# Patient Record
Sex: Male | Born: 1965 | Race: White | Hispanic: No | Marital: Married | Smoking: Never smoker
Health system: Southern US, Community
[De-identification: ages and names within clinical notes are randomized; demographics above are authoritative.]

## PROBLEM LIST (undated history)

## (undated) DIAGNOSIS — K219 Gastro-esophageal reflux disease without esophagitis: Secondary | ICD-10-CM

## (undated) DIAGNOSIS — F419 Anxiety disorder, unspecified: Secondary | ICD-10-CM

## (undated) HISTORY — DX: Anxiety disorder, unspecified: F41.9

## (undated) HISTORY — PX: VASECTOMY: SHX75

## (undated) HISTORY — PX: TONSILLECTOMY: SUR1361

## (undated) HISTORY — DX: Gastro-esophageal reflux disease without esophagitis: K21.9

## (undated) HISTORY — PX: COLOSTOMY: SHX63

---

## 2017-04-27 ENCOUNTER — Encounter (HOSPITAL_COMMUNITY): Payer: Self-pay | Admitting: Emergency Medicine

## 2017-04-27 ENCOUNTER — Ambulatory Visit (HOSPITAL_COMMUNITY)
Admission: EM | Admit: 2017-04-27 | Discharge: 2017-04-27 | Disposition: A | Discharge status: Home / Self Care | Payer: 59 | Attending: Family Medicine | Admitting: Family Medicine

## 2017-04-27 DIAGNOSIS — T162XXA Foreign body in left ear, initial encounter: Secondary | ICD-10-CM

## 2017-04-27 DIAGNOSIS — H9202 Otalgia, left ear: Secondary | ICD-10-CM

## 2017-04-27 MED ORDER — LIDOCAINE VISCOUS 2 % MT SOLN
OROMUCOSAL | Status: AC
  Filled 2017-04-27: qty 15

## 2017-04-27 NOTE — ED Triage Notes (Signed)
Pt believes some type of insect has flown into his left ear and stung him several times.  He is not sure if the insect is still in there, but he thinks it is.

## 2017-04-29 NOTE — ED Provider Notes (Signed)
  [facility]   [id number] 04/27/17 Arrival Time: 1301  ASSESSMENT & PLAN:  1. Foreign body of left ear, initial encounter    Viscous lidocaine used to numb ear canal and kill insect. Removed without complication using alligator forceps.  Re-exam of L ear reveals ear canal swelling with patency. Slight bleeding. No parts of insect observed in ear canal. Flushed.  Declines antibiotic ear drop at this time. Will monitor closely.  Outlined signs and symptoms indicating need for more acute intervention. Follow up here or in the Emergency Department if worsening. Patient verbalized understanding. After Visit Summary given.   SUBJECTIVE:  Patrick Harper is a 51 y.o. male who presents with complaint of insect that flew into his L ear an hour or so ago. Painful. Feels insect stinging him. Self removal with finger unsuccessful.  ROS: As per HPI.   OBJECTIVE:  Vitals:   04/27/17 1318  BP: 126/90  Pulse: 75  Temp: 98.6 F (37 C)  TempSrc: Oral  SpO2: 96%     General appearance: alert, cooperative, appears stated age and no distress Head: normocephalic; atraumatic Eyes: conjunctivae/corneas normal; EOMI. Ears: left with insect inside ear canal; appears to be up against TM; some swelling and erythema of ear canal Skin: normal   No Known Allergies  PMHx, SurgHx, SocialHx, Medications, and Allergies were reviewed in the Visit Navigator and updated as appropriate.       Vanessa Kick, MD 04/29/17 [phone]

## 2018-09-05 DIAGNOSIS — Z125 Encounter for screening for malignant neoplasm of prostate: Secondary | ICD-10-CM | POA: Diagnosis not present

## 2018-09-05 DIAGNOSIS — E7849 Other hyperlipidemia: Secondary | ICD-10-CM | POA: Diagnosis not present

## 2018-09-05 DIAGNOSIS — Z0000 Encounter for general adult medical examination without abnormal findings: Secondary | ICD-10-CM | POA: Diagnosis not present

## 2018-09-12 DIAGNOSIS — M545 Low back pain: Secondary | ICD-10-CM | POA: Diagnosis not present

## 2018-09-12 DIAGNOSIS — G250 Essential tremor: Secondary | ICD-10-CM | POA: Diagnosis not present

## 2018-09-12 DIAGNOSIS — E7849 Other hyperlipidemia: Secondary | ICD-10-CM | POA: Diagnosis not present

## 2018-09-12 DIAGNOSIS — Z0000 Encounter for general adult medical examination without abnormal findings: Secondary | ICD-10-CM | POA: Diagnosis not present

## 2018-09-12 DIAGNOSIS — Z23 Encounter for immunization: Secondary | ICD-10-CM | POA: Diagnosis not present

## 2018-09-12 DIAGNOSIS — Z1389 Encounter for screening for other disorder: Secondary | ICD-10-CM | POA: Diagnosis not present

## 2018-09-12 DIAGNOSIS — D72819 Decreased white blood cell count, unspecified: Secondary | ICD-10-CM | POA: Diagnosis not present

## 2018-09-14 ENCOUNTER — Other Ambulatory Visit: Payer: Self-pay | Admitting: Internal Medicine

## 2018-09-14 DIAGNOSIS — E785 Hyperlipidemia, unspecified: Secondary | ICD-10-CM

## 2018-11-17 DIAGNOSIS — R400 Somnolence: Secondary | ICD-10-CM | POA: Diagnosis not present

## 2018-11-17 DIAGNOSIS — Z6830 Body mass index (BMI) 30.0-30.9, adult: Secondary | ICD-10-CM | POA: Diagnosis not present

## 2018-11-17 DIAGNOSIS — R0683 Snoring: Secondary | ICD-10-CM | POA: Diagnosis not present

## 2019-04-29 DIAGNOSIS — Z1159 Encounter for screening for other viral diseases: Secondary | ICD-10-CM | POA: Diagnosis not present

## 2019-07-04 DIAGNOSIS — M5416 Radiculopathy, lumbar region: Secondary | ICD-10-CM | POA: Diagnosis not present

## 2019-07-04 DIAGNOSIS — M5136 Other intervertebral disc degeneration, lumbar region: Secondary | ICD-10-CM | POA: Diagnosis not present

## 2019-07-04 DIAGNOSIS — M545 Low back pain: Secondary | ICD-10-CM | POA: Diagnosis not present

## 2019-09-11 DIAGNOSIS — Z125 Encounter for screening for malignant neoplasm of prostate: Secondary | ICD-10-CM | POA: Diagnosis not present

## 2019-09-11 DIAGNOSIS — Z0000 Encounter for general adult medical examination without abnormal findings: Secondary | ICD-10-CM | POA: Diagnosis not present

## 2019-09-11 DIAGNOSIS — E7849 Other hyperlipidemia: Secondary | ICD-10-CM | POA: Diagnosis not present

## 2019-09-17 ENCOUNTER — Other Ambulatory Visit: Payer: Self-pay | Admitting: *Deleted

## 2019-09-17 DIAGNOSIS — Z20822 Contact with and (suspected) exposure to covid-19: Secondary | ICD-10-CM

## 2019-09-18 DIAGNOSIS — Z0000 Encounter for general adult medical examination without abnormal findings: Secondary | ICD-10-CM | POA: Diagnosis not present

## 2019-09-18 DIAGNOSIS — R0683 Snoring: Secondary | ICD-10-CM | POA: Diagnosis not present

## 2019-09-18 DIAGNOSIS — R400 Somnolence: Secondary | ICD-10-CM | POA: Diagnosis not present

## 2019-09-18 DIAGNOSIS — M5136 Other intervertebral disc degeneration, lumbar region: Secondary | ICD-10-CM | POA: Diagnosis not present

## 2019-09-18 DIAGNOSIS — E785 Hyperlipidemia, unspecified: Secondary | ICD-10-CM | POA: Diagnosis not present

## 2019-09-18 DIAGNOSIS — Z23 Encounter for immunization: Secondary | ICD-10-CM | POA: Diagnosis not present

## 2019-09-18 LAB — NOVEL CORONAVIRUS, NAA: SARS-CoV-2, NAA: NOT DETECTED

## 2019-09-24 ENCOUNTER — Other Ambulatory Visit: Payer: Self-pay | Admitting: Internal Medicine

## 2019-09-24 DIAGNOSIS — E785 Hyperlipidemia, unspecified: Secondary | ICD-10-CM

## 2019-10-10 ENCOUNTER — Encounter: Payer: Self-pay | Admitting: Gastroenterology

## 2019-10-19 ENCOUNTER — Ambulatory Visit
Admission: RE | Admit: 2019-10-19 | Discharge: 2019-10-19 | Disposition: A | Discharge status: Home / Self Care | Payer: No Typology Code available for payment source | Source: Ambulatory Visit | Attending: Internal Medicine | Admitting: Internal Medicine

## 2019-10-19 DIAGNOSIS — E785 Hyperlipidemia, unspecified: Secondary | ICD-10-CM

## 2019-10-31 ENCOUNTER — Ambulatory Visit (AMBULATORY_SURGERY_CENTER): Payer: Self-pay | Admitting: *Deleted

## 2019-10-31 ENCOUNTER — Other Ambulatory Visit: Payer: Self-pay

## 2019-10-31 VITALS — Ht 72.0 in | Wt 215.0 lb

## 2019-10-31 DIAGNOSIS — Z1159 Encounter for screening for other viral diseases: Secondary | ICD-10-CM

## 2019-10-31 DIAGNOSIS — Z1211 Encounter for screening for malignant neoplasm of colon: Secondary | ICD-10-CM

## 2019-10-31 MED ORDER — NA SULFATE-K SULFATE-MG SULF 17.5-3.13-1.6 GM/177ML PO SOLN: 1.0000 | Freq: Once | ORAL | 0 refills | Status: AC

## 2019-10-31 NOTE — Progress Notes (Signed)
No egg or soy allergy known to patient  No issues with past sedation with any surgeries  or procedures, no intubation problems  No diet pills per patient No home 02 use per patient  No blood thinners per patient  Pt denies issues with constipation  No A fib or A flutter  EMMI video sent to pt.  Due to the COVID-19 pandemic we are asking patients to follow these guidelines. Please only bring one care partner. Please be aware that your care partner may wait in the car in the parking lot or if they feel like they will be too hot to wait in the car, they may wait in the lobby on the 4th floor. All care partners are required to wear a mask the entire time (we do not have any that we can provide them), they need to practice social distancing, and we will do a Covid check for all patient's and care partners when you arrive. Also we will check their temperature and your temperature. If the care partner waits in their car they need to stay in the parking lot the entire time and we will call them on their cell phone when the patient is ready for discharge so they can bring the car to the front of the building. Also all patient's will need to wear a mask into building.   Pt verified name, DOB, address and insurance during PV today. Pt mailed instruction packet to included paper to complete and mail back to [facility] with addressed and stamped envelope, Emmi video, copy of consent form to read and not return, and instructions.  coupon mailed in packet. PV completed over the phone. Pt encouraged to call with questions or issues

## 2019-11-07 ENCOUNTER — Ambulatory Visit (INDEPENDENT_AMBULATORY_CARE_PROVIDER_SITE_OTHER): Payer: Self-pay

## 2019-11-07 ENCOUNTER — Other Ambulatory Visit: Payer: Self-pay | Admitting: Gastroenterology

## 2019-11-07 DIAGNOSIS — Z1159 Encounter for screening for other viral diseases: Secondary | ICD-10-CM | POA: Diagnosis not present

## 2019-11-08 LAB — SARS CORONAVIRUS 2 (TAT 6-24 HRS): SARS Coronavirus 2: NEGATIVE

## 2019-11-12 ENCOUNTER — Other Ambulatory Visit: Payer: Self-pay

## 2019-11-12 ENCOUNTER — Other Ambulatory Visit: Payer: Self-pay | Admitting: *Deleted

## 2019-11-12 ENCOUNTER — Encounter: Payer: Self-pay | Admitting: Gastroenterology

## 2019-11-12 ENCOUNTER — Ambulatory Visit (AMBULATORY_SURGERY_CENTER): Payer: BC Managed Care – PPO | Admitting: Gastroenterology

## 2019-11-12 VITALS — BP 110/72 | HR 64 | Temp 97.6°F | Resp 11 | Ht 72.0 in | Wt 215.0 lb

## 2019-11-12 DIAGNOSIS — Z1211 Encounter for screening for malignant neoplasm of colon: Secondary | ICD-10-CM | POA: Diagnosis not present

## 2019-11-12 DIAGNOSIS — D122 Benign neoplasm of ascending colon: Secondary | ICD-10-CM | POA: Diagnosis not present

## 2019-11-12 NOTE — Progress Notes (Signed)
Temp-JB VS-CW  Pt's states no medical or surgical changes since previsit or office visit.  

## 2019-11-12 NOTE — Progress Notes (Signed)
Pt tolerated well. VSS. Awake and to recovery. 

## 2019-11-12 NOTE — Op Note (Signed)
[facility] Patient Name: Patrick Harper Procedure Date: 11/12/2019 8:39 AM MRN: [id number] Endoscopist: [place]. Loletha Carrow , MD Age: 54 Referring MD:  Date of Birth: 1966/09/03 Gender: Male Account #: [id number] Procedure:                Colonoscopy Indications:              Screening for colorectal malignant neoplasm, This                            is the patient's first colonoscopy Medicines:                Monitored Anesthesia Care Procedure:                Pre-Anesthesia Assessment:                           - Prior to the procedure, a History and Physical                            was performed, and patient medications and                            allergies were reviewed. The patient's tolerance of                            previous anesthesia was also reviewed. The risks                            and benefits of the procedure and the sedation                            options and risks were discussed with the patient.                            All questions were answered, and informed consent                            was obtained. Prior Anticoagulants: The patient has                            taken no previous anticoagulant or antiplatelet                            agents. ASA Grade Assessment: II - A patient with                            mild systemic disease. After reviewing the risks                            and benefits, the patient was deemed in                            satisfactory condition to undergo the procedure.  After obtaining informed consent, the colonoscope                            was passed under direct vision. Throughout the                            procedure, the patient's blood pressure, pulse, and                            oxygen saturations were monitored continuously. The                            Colonoscope was introduced through the anus and                            advanced to the the cecum,  identified by                            appendiceal orifice and ileocecal valve. The                            colonoscopy was performed without difficulty. The                            patient tolerated the procedure well. The quality                            of the bowel preparation was fair in left colon,                            good in right colon after lavage. The ileocecal                            valve, appendiceal orifice, and rectum were                            photographed. The bowel preparation used was SUPREP                            via split dose instruction. Scope In: 8:46:02 AM Scope Out: 9:03:02 AM Scope Withdrawal Time: 0 hours 12 minutes 4 seconds  Total Procedure Duration: 0 hours 17 minutes 0 seconds  Findings:                 The perianal and digital rectal examinations were                            normal.                           Two sessile polyps were found in the proximal                            ascending colon. The polyps were diminutive in  size. These polyps were removed with a cold snare.                            Resection and retrieval were complete.                           Multiple diverticula were found in the left colon.                           The exam was otherwise without abnormality on                            direct and retroflexion views. Complications:            No immediate complications. Estimated Blood Loss:     Estimated blood loss was minimal. Impression:               - Preparation of the colon was fair.                           - Two diminutive polyps in the proximal ascending                            colon, removed with a cold snare. Resected and                            retrieved.                           - Diverticulosis in the left colon.                           - The examination was otherwise normal on direct                            and retroflexion  views. Recommendation:           - Patient has a contact number available for                            emergencies. The signs and symptoms of potential                            delayed complications were discussed with the                            patient. Return to normal activities tomorrow.                            Written discharge instructions were provided to the                            patient.                           - Resume previous diet.                           -  Continue present medications.                           - Await pathology results.                           - Repeat colonoscopy is recommended for                            surveillance. The colonoscopy date will be                            determined after pathology results from today's                            exam become available for review. Lennox Leikam L. Loletha Carrow, MD 11/12/2019 9:06:59 AM This report has been signed electronically.

## 2019-11-12 NOTE — Progress Notes (Signed)
Called to room to assist during endoscopic procedure.  Patient ID and intended procedure confirmed with present staff. Received instructions for my participation in the procedure from the performing physician.  

## 2019-11-12 NOTE — Patient Instructions (Signed)
Thank you for allowing [place] to care for you today!  Await pathology results of polyps removed by mail, approximately 2 weeks.  Will make recommendation at that time for next colonoscopy.  Resume previous diet and medications today.  Resume your normal activities tomorrow.  Handouts for polyps and diverticulosis provided.   YOU HAD AN ENDOSCOPIC PROCEDURE TODAY AT [facility]:   Refer to the procedure report that was given to you for any specific questions about what was found during the examination.  If the procedure report does not answer your questions, please call your gastroenterologist to clarify.  If you requested that your care partner not be given the details of your procedure findings, then the procedure report has been included in a sealed envelope for you to review at your convenience later.  YOU SHOULD EXPECT: Some feelings of bloating in the abdomen. Passage of more gas than usual.  Walking can help get rid of the air that was put into your GI tract during the procedure and reduce the bloating. If you had a lower endoscopy (such as a colonoscopy or flexible sigmoidoscopy) you may notice spotting of blood in your stool or on the toilet paper. If you underwent a bowel prep for your procedure, you may not have a normal bowel movement for a few days.  Please Note:  You might notice some irritation and congestion in your nose or some drainage.  This is from the oxygen used during your procedure.  There is no need for concern and it should clear up in a day or so.  SYMPTOMS TO REPORT IMMEDIATELY:   Following lower endoscopy (colonoscopy or flexible sigmoidoscopy):  Excessive amounts of blood in the stool  Significant tenderness or worsening of abdominal pains  Swelling of the abdomen that is new, acute  Fever of 100F or higher   For urgent or emergent issues, a gastroenterologist can be reached at any hour by calling [phone].   DIET:  We do recommend a  small meal at first, but then you may proceed to your regular diet.  Drink plenty of fluids but you should avoid alcoholic beverages for 24 hours.  ACTIVITY:  You should plan to take it easy for the rest of today and you should NOT DRIVE or use heavy machinery until tomorrow (because of the sedation medicines used during the test).    FOLLOW UP: Our staff will call the number listed on your records 48-72 hours following your procedure to check on you and address any questions or concerns that you may have regarding the information given to you following your procedure. If we do not reach you, we will leave a message.  We will attempt to reach you two times.  During this call, we will ask if you have developed any symptoms of COVID 19. If you develop any symptoms (ie: fever, flu-like symptoms, shortness of breath, cough etc.) before then, please call [phone].  If you test positive for Covid 19 in the 2 weeks post procedure, please call and report this information to [place].    If any biopsies were taken you will be contacted by phone or by letter within the next 1-3 weeks.  Please call us at [phone] if you have not heard about the biopsies in 3 weeks.    SIGNATURES/CONFIDENTIALITY: You and/or your care partner have signed paperwork which will be entered into your electronic medical record.  These signatures attest to the fact that that the information above  on your After Visit Summary has been reviewed and is understood.  Full responsibility of the confidentiality of this discharge information lies with you and/or your care-partner.

## 2019-11-14 ENCOUNTER — Telehealth: Payer: Self-pay

## 2019-11-14 NOTE — Telephone Encounter (Signed)
  Follow up Call-  Call back number 11/12/2019  Post procedure Call Back phone  # [phone]  Permission to leave phone message Yes     Patient questions:  Do you have a fever, pain , or abdominal swelling? No. Pain Score  0 *  Have you tolerated food without any problems? Yes.    Have you been able to return to your normal activities? Yes.    Do you have any questions about your discharge instructions: Diet   No. Medications  No. Follow up visit  No.  Do you have questions or concerns about your Care? No.  Actions: * If pain score is 4 or above: No action needed, pain <4.   1. Have you developed a fever since your procedure? no  2.   Have you had an respiratory symptoms (SOB or cough) since your procedure? no  3.   Have you tested positive for COVID 19 since your procedure no  4.   Have you had any family members/close contacts diagnosed with the COVID 19 since your procedure?  no   If yes to any of these questions please route to Joylene John, RN and Alphonsa Gin, Therapist, sports.

## 2019-11-15 ENCOUNTER — Encounter: Payer: Self-pay | Admitting: Gastroenterology

## 2019-12-25 ENCOUNTER — Ambulatory Visit: Payer: BC Managed Care – PPO | Attending: Internal Medicine

## 2019-12-25 DIAGNOSIS — R519 Headache, unspecified: Secondary | ICD-10-CM | POA: Diagnosis not present

## 2019-12-25 DIAGNOSIS — Z20822 Contact with and (suspected) exposure to covid-19: Secondary | ICD-10-CM | POA: Diagnosis not present

## 2019-12-26 LAB — NOVEL CORONAVIRUS, NAA: SARS-CoV-2, NAA: NOT DETECTED

## 2020-08-19 ENCOUNTER — Encounter: Payer: Self-pay | Admitting: Neurology

## 2020-08-20 ENCOUNTER — Other Ambulatory Visit: Payer: Self-pay

## 2020-08-20 ENCOUNTER — Ambulatory Visit: Payer: BC Managed Care – PPO | Admitting: Neurology

## 2020-08-20 ENCOUNTER — Encounter: Payer: Self-pay | Admitting: Neurology

## 2020-08-20 VITALS — BP 123/77 | HR 75 | Ht 72.0 in | Wt 216.0 lb

## 2020-08-20 DIAGNOSIS — R0683 Snoring: Secondary | ICD-10-CM | POA: Insufficient documentation

## 2020-08-20 DIAGNOSIS — G4730 Sleep apnea, unspecified: Secondary | ICD-10-CM | POA: Diagnosis not present

## 2020-08-20 DIAGNOSIS — G4710 Hypersomnia, unspecified: Secondary | ICD-10-CM | POA: Diagnosis not present

## 2020-08-20 DIAGNOSIS — G478 Other sleep disorders: Secondary | ICD-10-CM | POA: Diagnosis not present

## 2020-08-20 NOTE — Patient Instructions (Signed)

## 2020-08-20 NOTE — Progress Notes (Signed)
SLEEP MEDICINE CLINIC    Provider:  Larey Seat, MD  Primary Care Physician:  Shon Baton, MD [place] [address]     Referring Provider: Shon Baton, [address] [place],  [place] [address]          Chief Complaint according to patient   Patient presents with:     New Patient (Initial Visit)     pt alone, rm 10. presents today to advise that his wife has stated he snores and gasps in sleep. he averages 6-7 hrs but doesn't sleep straight through, and wakes up tired. He has tried nose strips and dental device and was not successful.      HISTORY OF PRESENT ILLNESS:  Patrick Harper is a 54  Year- old  Caucasian male patient and seen here in a sleep consultation  on 08/20/2020 from Dr Virgina Jock.  Chief concern according to patient : Patrick Harper explained that he has tried to reduce his snoring and gasping in his sleep by using a dental device but was not successful and actually did not fit well.  He has he has also tried nasal strips without much success either.  He wakes up and feels not refreshed not restored from his sleep even if he averages 6 to 7 hours.  He does often reporting fragmented interrupted sleep.   I have the pleasure of seeing Patrick Harper on 08-20-2020, a right -handed Caucasian male with a possible sleep disorder.  He  has a past medical history of Anxiety and GERD (gastroesophageal reflux disease). He had hernia surgery as a child.   His dental device was made by a dentist but he did not have a sleep study prior or after.   Sleep relevant medical history: Nocturia 1-2,  Tonsillectomy.     Family medical /sleep history: no other family member on CPAP with OSA, nobody with insomnia, no sleep walkers.    Social history:  Patient is working as a Chief Strategy Officer , for  the [employer], in conjunction with the [employer], and lives in a household with spouse - daughter is out of college, youngest started college at [employer].    The patient currently works in outreach, irregular hours.  Pets are present. Tobacco use; never .  ETOH use - socially, Caffeine intake in form of Coffee( daily 2-3 cups ) Soda( diet/ 2 week) Tea (/) or energy drinks.Regular exercise in form of golf..   Hobbies : golf. Sleep habits are as follows: The patient's dinner time is between 6-8 PM.  The patient goes to bed at 10 PM and continues to sleep for several hours, wakes for one bathroom break, at 2  AM.   The preferred sleep position is sideways, with the support of 2 pillows.  Dreams are reportedly frequent/ kicking in his sleep.Marland Kitchen   6 -7 AM is the usual rise time.  The patient wakes up spontaneously.   She reports not feeling refreshed or restored in AM, with symptoms such as dry mouth , rare morning headaches , and residual fatigue.  Naps are taken frequently, since working form home - 3-4 days a week, lasting from 20-30 minutes and are more refreshing than nocturnal sleep.    Review of Systems: Out of a complete 14 system review, the patient complains of only the following symptoms, and all other reviewed systems are negative.:  Fatigue, sleepiness , snoring, fragmented sleep, forme fruste of RLS  How likely are you to doze in the following situations: 0 = not likely, 1 = slight chance, 2 = moderate chance, 3 = high chance   Sitting and Reading? Watching Television? Sitting inactive in a public place (theater or meeting)? As a passenger in a car for an hour without a break? Lying down in the afternoon when circumstances permit? Sitting and talking to someone? Sitting quietly after lunch without alcohol? In a car, while stopped for a few minutes in traffic?   Total = 12/ 24 points   FSS endorsed at 43/ 63 points.   Social History   Socioeconomic History   Marital status: Married    Spouse name: Not on file   Number of children: Not on file   Years of education: Not on file   Highest education level: Not on file   Occupational History   Not on file  Tobacco Use   Smoking status: Never Smoker   Smokeless tobacco: Never Used  Vaping Use   Vaping Use: Never used  Substance and Sexual Activity   Alcohol use: Yes    Comment: socially on weekends   Drug use: No   Sexual activity: Not on file  Other Topics Concern   Not on file  Social History Narrative   Not on file   Social Determinants of Health   Financial Resource Strain:    Difficulty of Paying Living Expenses: Not on file  Food Insecurity:    Worried About [place] in the Last Year: Not on file   Ran Out of Food in the Last Year: Not on file  Transportation Needs:    Lack of Transportation (Medical): Not on file   Lack of Transportation (Non-Medical): Not on file  Physical Activity:    Days of Exercise per Week: Not on file   Minutes of Exercise per Session: Not on file  Stress:    Feeling of Stress : Not on file  Social Connections:    Frequency of Communication with Friends and Family: Not on file   Frequency of Social Gatherings with Friends and Family: Not on file   Attends Religious Services: Not on file   Active Member of Clubs or Organizations: Not on file   Attends Archivist Meetings: Not on file   Marital Status: Not on file    Family History  Problem Relation Age of Onset   Brain cancer Father    Colon cancer Neg Hx    Colon polyps Neg Hx    Esophageal cancer Neg Hx    Rectal cancer Neg Hx    Stomach cancer Neg Hx     Past Medical History:  Diagnosis Date   Anxiety    GERD (gastroesophageal reflux disease)     Past Surgical History:  Procedure Laterality Date   COLOSTOMY     TONSILLECTOMY     as child   VASECTOMY       Current Outpatient Medications on File Prior to Visit  Medication Sig Dispense Refill   buPROPion (WELLBUTRIN XL) 150 MG 24 hr tablet      citalopram (CELEXA) 40 MG tablet Take 40 mg by mouth daily.      ibuprofen (ADVIL)  200 MG tablet 1-2 tabs by mouth as needed for discomfort     Multiple Vitamin (MULTI-VITAMIN) tablet Take by mouth.     rosuvastatin (CRESTOR) 10 MG tablet Take 10 mg by mouth at bedtime.     No current facility-administered medications on  file prior to visit.    No Known Allergies  Physical exam:  Today's Vitals   08/20/20 1047  BP: 123/77  Pulse: 75  Weight: 216 lb (98 kg)  Height: 6' (1.829 m)   Body mass index is 29.29 kg/m.   Wt Readings from Last 3 Encounters:  08/20/20 216 lb (98 kg)  11/12/19 215 lb (97.5 kg)  10/31/19 215 lb (97.5 kg)     Ht Readings from Last 3 Encounters:  08/20/20 6' (1.829 m)  11/12/19 6' (1.829 m)  10/31/19 6' (1.829 m)      General: The patient is awake, alert and appears not in acute distress. The patient is well groomed. Head: Normocephalic, atraumatic. Neck is supple. Mallampati 2- with lateral pillars., narrow.  neck circumference:17 inches . Nasal airflow patent.  Retrognathia is seen.  Dental status: intact  Cardiovascular:  Regular rate and cardiac rhythm by pulse,  without distended neck veins. Respiratory: Lungs are clear to auscultation.  Skin:  Without evidence of ankle edema, or rash. Trunk: The patient's posture is erect.   Neurologic exam : The patient is awake and alert, oriented to place and time.   Memory subjective described as intact.  Attention span & concentration ability appears normal.  Speech is fluent,  without  dysarthria, dysphonia or aphasia.  Mood and affect are appropriate.   Cranial nerves: no loss of smell or taste reported  Pupils are equal and briskly reactive to light. Funduscopic exam deferred.   Extraocular movements in vertical and horizontal planes were intact and without nystagmus. No Diplopia. Visual fields by finger perimetry are intact. Hearing was intact to soft voice and finger rubbing.    Facial sensation intact to fine touch.  Facial motor strength is symmetric and tongue and uvula  move midline.  Neck ROM : rotation, tilt and flexion extension were normal for age and shoulder shrug was symmetrical.    Motor exam:  Symmetric bulk, tone and ROM.   Normal tone without cog wheeling, symmetric grip strength . Sensory:  Fine touch, pinprick and vibration were tested  and  normal.  Proprioception tested in the upper extremities was normal.  Coordination: Rapid alternating movements in the fingers/hands were of normal speed.  The Finger-to-nose maneuver was intact without evidence of ataxia, dysmetria or tremor.  Gait and station: Patient could rise unassisted from a seated position, walked without assistive device.  Stance is of normal width/ base and the patient turned with 3 steps.  Toe and heel walk were deferred.  Deep tendon reflexes: in the upper and lower extremities are symmetric and intact.  Babinski response was deferred.       After spending a total time of 40 minutes face to face and additional time for physical and neurologic examination, review of laboratory studies,  personal review of imaging studies, reports and results of other testing and review of referral information / records as far as provided in visit, I have established the following assessments:  1) Patrick Harper, his body mass index is under 30 and may contribute to snoring but is not at the point where I would consider risk factor for obstructive sleep apnea.  However obstructive sleep apnea seems to have been observed by his wife who has reported that he breathes a sometimes irregularly at that he is a loud snorer man in supine sleep position.  Since a dental device has not given him the desired relief and nasal strips have neither I  think it is time for a formal sleep evaluation with a sleep test.  We can do either a sleep lab test or a home sleep test which should be sufficient to screen for sleep apnea.  There are no risk factors that I can see for the presence of central sleep  apnea.    My Plan is to proceed with:  1) HST or PSG- screening for apnea.     I would like to thank Shon Baton, MD and Shon Baton, [address],  [place] [address] for allowing me to meet with and to take care of this pleasant patient.   In short, Patrick Harper is presenting with snoring and witnessed apnea, having non restorative.   I plan to follow up either personally or through our NP within 2-3  month.   CC: I will share my notes with Dr Russo/   Electronically signed by: Larey Seat, MD 08/20/2020 11:10 AM  [facility] certified by The [employer] of Sleep Medicine and Diplomate of the [employer] of Sleep Medicine. Board certified In Neurology through the [place], Fellow of the [employer] of Neurology. Medical Director of [employer].

## 2020-09-15 ENCOUNTER — Ambulatory Visit (INDEPENDENT_AMBULATORY_CARE_PROVIDER_SITE_OTHER): Payer: BC Managed Care – PPO | Admitting: Neurology

## 2020-09-15 DIAGNOSIS — G478 Other sleep disorders: Secondary | ICD-10-CM

## 2020-09-15 DIAGNOSIS — G4733 Obstructive sleep apnea (adult) (pediatric): Secondary | ICD-10-CM | POA: Diagnosis not present

## 2020-09-15 DIAGNOSIS — G4710 Hypersomnia, unspecified: Secondary | ICD-10-CM

## 2020-09-15 DIAGNOSIS — R0683 Snoring: Secondary | ICD-10-CM

## 2020-09-30 NOTE — Procedures (Signed)
Sleep Study Report   Patient Information     First Name: Patrick Last Name: Harper ID: [id number]  Birth Date: 2066/01/05 Age: 54 Gender: Male  Referring Provider: Dr. Shon Baton, MD BMI: 29.3 (W=216 lb, H=6' 0'')  Neck Circ.:  17 '' Epworth:  12/24   Sleep Study Information    Study Date: 09/15/20 S/H/A Version: [id number] / [id number] / 79  History:    Jonetta Speak was seen on 08-20-2020, a right -handed Caucasian male with a possible sleep disorder.  He is a 75- year -old patient who he has tried to reduce his snoring and gasping in his sleep by using a dental device but was not successful.  He has he has also tried nasal strips without much success either.  He wakes up and feels not refreshed not restored from his sleep even if he averages 6 to 7 hours.  He does often report fragmented interrupted sleep. He has a past medical history of Anxiety and GERD (gastroesophageal reflux disease). He had hernia surgery as a child.      Summary & Diagnosis:      This HST confirmed a moderate degree of OSA at an AHI of 21.6/h and REM AHI of 25.6/h. there were additional RERA related arousals, reflected in the RDI. SpO2 at nadir was 75% with a total desaturation time of under 20 minutes.  Recommendations:     This degree of apnea and type of apnea can be treated by dental device or CPAP. It would also be treatable by [facility] device. If the patient doesn't object, I would start with CPAP treatment by autotitration, with a setting from 5-16 cm water, 2 cm EPR and mask of patient's choice (this doesn't need to be a FFM) and heated humidity to be provided.  Interpreting Physician: Larey Seat, MD           Sleep Summary  Oxygen Saturation Statistics   Start Study Time: End Study Time: Total Recording Time:  9:57:58 PM 6:57:08 AM   8 h, 59 min  Total Sleep Time % REM of Sleep Time:  7 h, 27 min  25.3    Mean: 94 Minimum: 75 Maximum: 99  Mean of Desaturations Nadirs (%):   91  Oxygen  Desaturation. %:  4-9 10-20 >20 Total  Events Number Total   62  9 86.1 12.5  1 1.4  72 100.0  Oxygen Saturation: <90 <=88 <85 <80 <70  Duration (minutes): Sleep % 8.9 2.0 6.9 2.9 1.5 0.6 0.8 0.2 0.0 0.0     Respiratory Indices      Total Events REM NREM All Night  pRDI: pAHI 3%: ODI 4%: pAHIc 3%: % CSR: pAHI 4%:  218  161  72  67 15.5 98 33.0 25.6 17.1 8.5 28.0 20.3 7.2 9.2 29.3 21.6 9.7 9.0 13.2       Pulse Rate Statistics during Sleep (BPM)      Mean: 68 Minimum: 53 Maximum: 97    Indices are calculated using technically valid sleep time of 7 h, 26 min.                                     pAHI=21.6  Mild              Moderate                    Severe                                                 5              15                    30   Body Position Statistics  Position Supine Prone Right Left Non-Supine  Sleep (min) 86.5 0.0 188.5 172.5 361.0  Sleep % 19.3 0.0 42.1 38.5 80.7  pRDI 39.6 N/A 20.8 33.5 26.8  pAHI 3% 28.5 N/A 11.5 29.3 20.0  ODI 4% 18.1 N/A 2.9 12.9 7.7            Left   Right  Supine    Snoring Statistics Snoring Level (dB) >40 >50 >60 >70 >80 >Threshold (45)  Sleep (min) 195.7 5.1 0.6 0.0 0.0 17.3  Sleep % 43.7 1.1 0.1 0.0 0.0 3.9    Mean: 41 dB

## 2020-09-30 NOTE — Addendum Note (Signed)
Addended by: Larey Seat on: 09/30/2020 01:03 PM   Modules accepted: Orders

## 2020-09-30 NOTE — Progress Notes (Signed)
Summary & Diagnosis:     This HST confirmed a moderate degree of OSA at an AHI of 21.6/h  and REM AHI of 25.6/h. there were additional RERA related  arousals, reflected in the RDI. SpO2 at nadir was 75% with a  total desaturation time of under 20 minutes.   Recommendations:    This degree of apnea and type of apnea can be treated by dental  device or CPAP. It would also be treatable by [facility] device. If  the patient doesn't object, I would start with CPAP treatment by  autotitration, with a setting from 5-16 cm water, 2 cm EPR and  mask of patient's choice (this doesn't need to be a FFM) and  heated humidity to be provided.  Interpreting Physician: Larey Seat, MD

## 2020-10-01 ENCOUNTER — Telehealth: Payer: Self-pay | Admitting: Neurology

## 2020-10-01 NOTE — Telephone Encounter (Signed)
Called patient to discuss sleep study results. No answer at this time. LVM for the patient to call back.   

## 2020-10-01 NOTE — Telephone Encounter (Signed)
-----   Message from Larey Seat, MD sent at 09/30/2020  1:02 PM EST ----- Summary & Diagnosis:     This HST confirmed a moderate degree of OSA at an AHI of 21.6/h  and REM AHI of 25.6/h. there were additional RERA related  arousals, reflected in the RDI. SpO2 at nadir was 75% with a  total desaturation time of under 20 minutes.   Recommendations:    This degree of apnea and type of apnea can be treated by dental  device or CPAP. It would also be treatable by [facility] device. If  the patient doesn't object, I would start with CPAP treatment by  autotitration, with a setting from 5-16 cm water, 2 cm EPR and  mask of patient's choice (this doesn't need to be a FFM) and  heated humidity to be provided.  Interpreting Physician: Larey Seat, MD

## 2020-10-03 ENCOUNTER — Encounter: Payer: Self-pay | Admitting: Neurology

## 2020-10-07 NOTE — Telephone Encounter (Signed)
I called Patrick Harper. I advised Patrick Harper that Dr. Brett Fairy reviewed their sleep study results and found that Patrick Harper has moderate OSA. Dr. Brett Fairy recommends that Patrick Harper starts auto CPAP. I reviewed PAP compliance expectations with the Patrick Harper. Patrick Harper is agreeable to starting a CPAP. I advised Patrick Harper that an order will be sent to a DME, [facility] ([facility] and [facility] ([facility]) will call the Patrick Harper within about one week after they file with the Patrick Harper's insurance. [facility])  will show the Patrick Harper how to use the machine, fit for masks, and troubleshoot the CPAP if needed. A follow up appt will need to be made for insurance purposes with Dr. Brett Fairy or NP. Patrick Harper verbalized understanding to call us once he is scheduled to pick up his machine that way a initial CPAP visit can be made 31-90 days from that date. A letter with all of this information in it will be mailed to the Patrick Harper as a reminder. I verified with the Patrick Harper that the address we have on file is correct. Patrick Harper verbalized understanding of results. Patrick Harper had no questions at this time but was encouraged to call back if questions arise. I have sent the order to [facility]) and have received confirmation that they have received the order.

## 2020-10-13 DIAGNOSIS — Z0000 Encounter for general adult medical examination without abnormal findings: Secondary | ICD-10-CM | POA: Diagnosis not present

## 2020-10-13 DIAGNOSIS — Z23 Encounter for immunization: Secondary | ICD-10-CM | POA: Diagnosis not present

## 2020-10-13 DIAGNOSIS — Z125 Encounter for screening for malignant neoplasm of prostate: Secondary | ICD-10-CM | POA: Diagnosis not present

## 2020-10-13 DIAGNOSIS — E785 Hyperlipidemia, unspecified: Secondary | ICD-10-CM | POA: Diagnosis not present

## 2020-10-13 DIAGNOSIS — R82998 Other abnormal findings in urine: Secondary | ICD-10-CM | POA: Diagnosis not present

## 2020-12-16 DIAGNOSIS — M549 Dorsalgia, unspecified: Secondary | ICD-10-CM | POA: Diagnosis not present

## 2020-12-25 DIAGNOSIS — M79642 Pain in left hand: Secondary | ICD-10-CM | POA: Diagnosis not present

## 2020-12-25 DIAGNOSIS — M5412 Radiculopathy, cervical region: Secondary | ICD-10-CM | POA: Diagnosis not present

## 2021-01-14 DIAGNOSIS — M542 Cervicalgia: Secondary | ICD-10-CM | POA: Diagnosis not present

## 2021-01-20 DIAGNOSIS — M542 Cervicalgia: Secondary | ICD-10-CM | POA: Diagnosis not present

## 2021-01-21 ENCOUNTER — Telehealth: Payer: Self-pay

## 2021-01-21 NOTE — Telephone Encounter (Signed)
Patient called and is concerned he has not heard from anyone on his cpap machine that was ordered in December. I sent Jeneen Rinks a message at [facility] to see what is wrong. He will get in touch with patient today. I gave my apologies and he was very understanding.

## 2021-08-03 IMAGING — CT CT HEART SCORING
3 series · 14 of 20 positions shown, 16 images · non-contrast
Comparison: None.

CLINICAL DATA: Hyperlipidemia

EXAM:
CT HEART FOR CALCIUM SCORING
TECHNIQUE: CT heart was performed using prospective ECG gating.
A non-contrast exam for calcium scoring was performed.
Note that this exam targets the heart and the chest was not imaged
in its entirety.

[Series 2: calcium scoring 2.00 qr36 bestdiast 70% · axial · 0.35mm/px · z∈[+1732,+1828]mm · 4 of 80 slices shown]
[im 16/80  vessel]
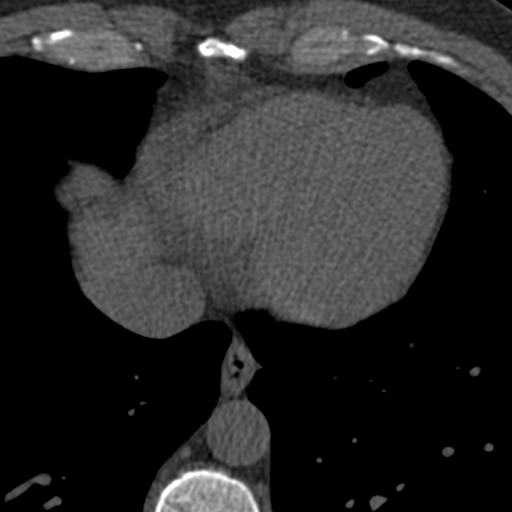
[im 32/80  vessel]
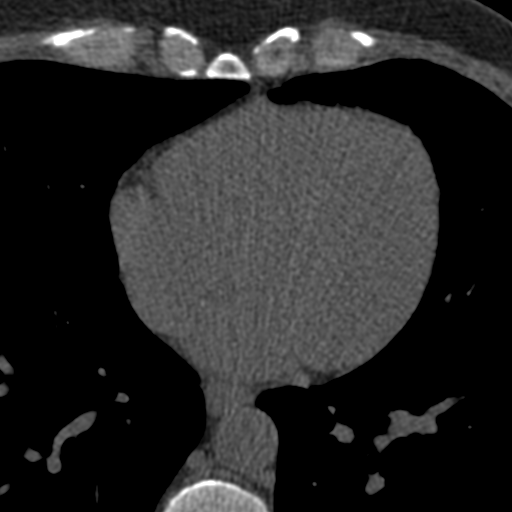
[im 48/80  vessel]
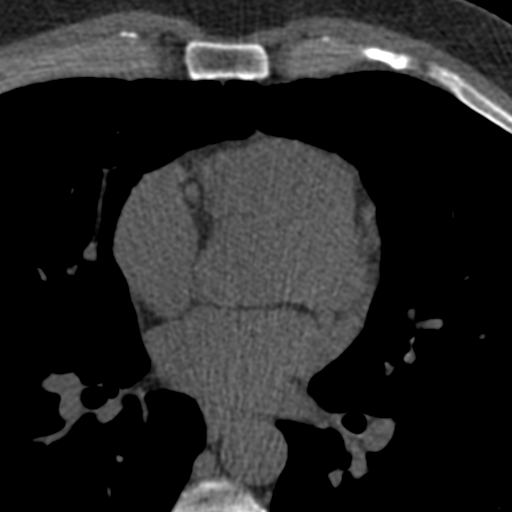
[im 64/80  vessel]
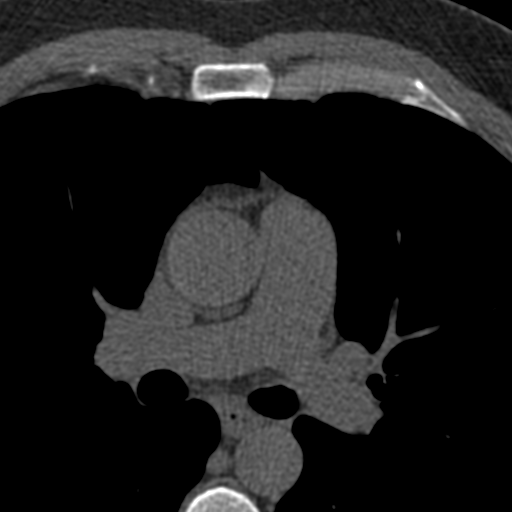

[Series 3: calcium scoring 2.00 br40 bestdiast 70% fov · axial · 0.65mm/px · z∈[+1730,+1834]mm · 5 of 79 slices shown, 7 images]
[im 14/79  vessel]
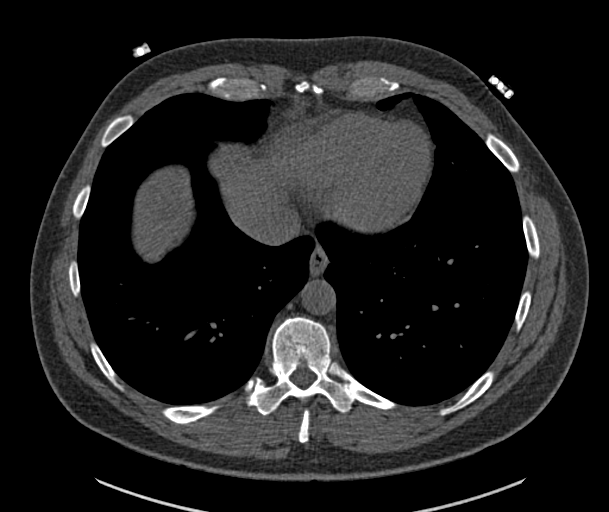
[im 14/79  lung]
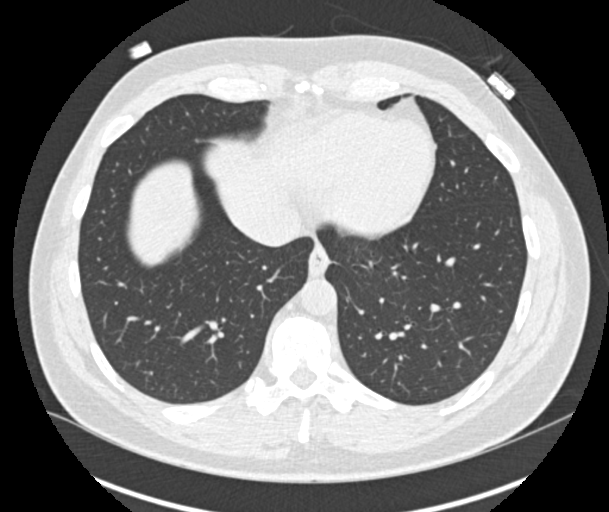
[im 27/79  vessel]
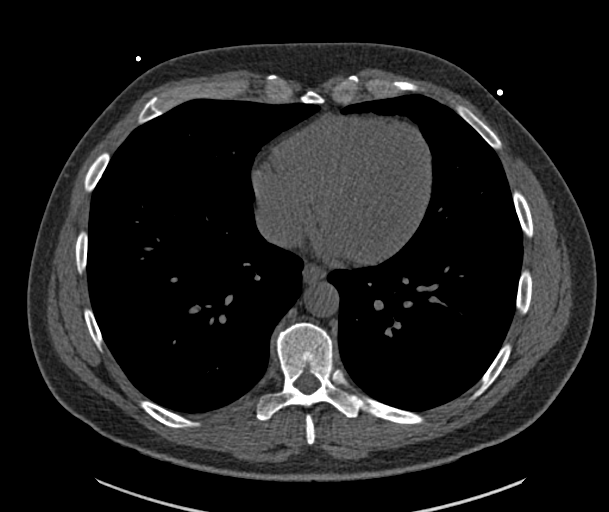
[im 40/79  vessel]
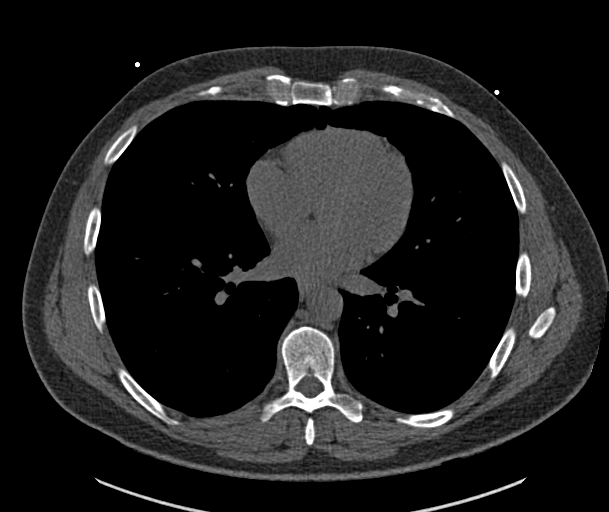
[im 53/79  vessel]
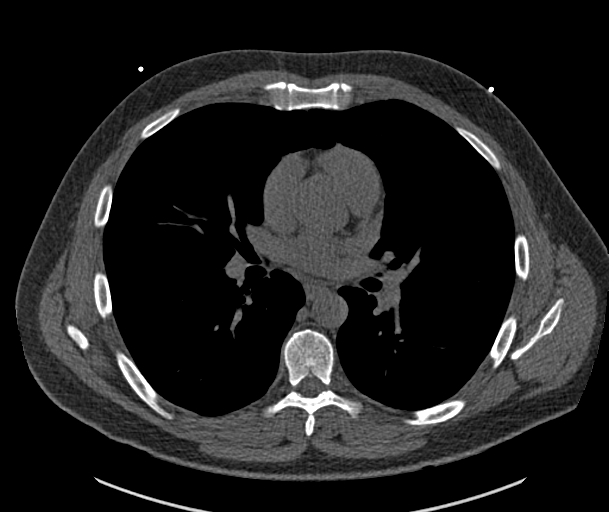
[im 66/79  vessel]
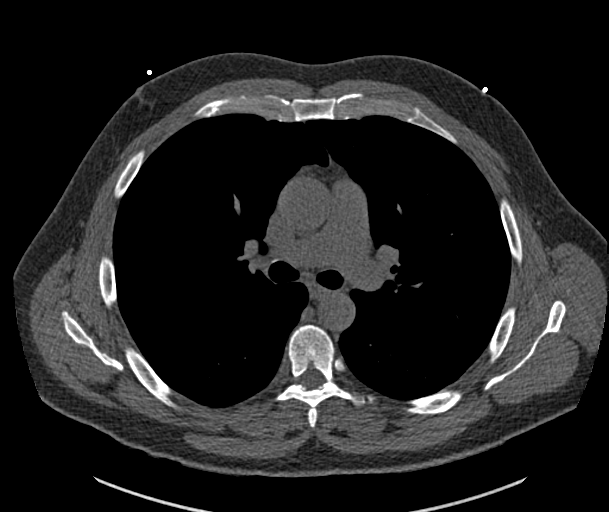
[im 66/79  lung]
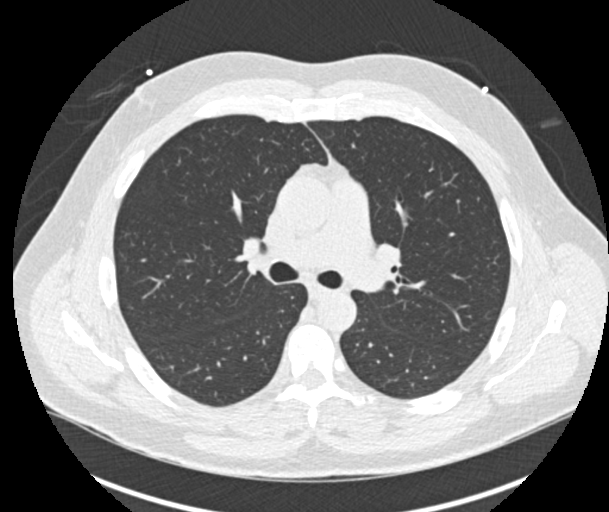

[Series 9: calcium scoring 2.00 br60 bestdiast 70% fov · axial · 0.65mm/px · z∈[+1730,+1834]mm · 5 of 79 slices shown]
[im 14/79  vessel]
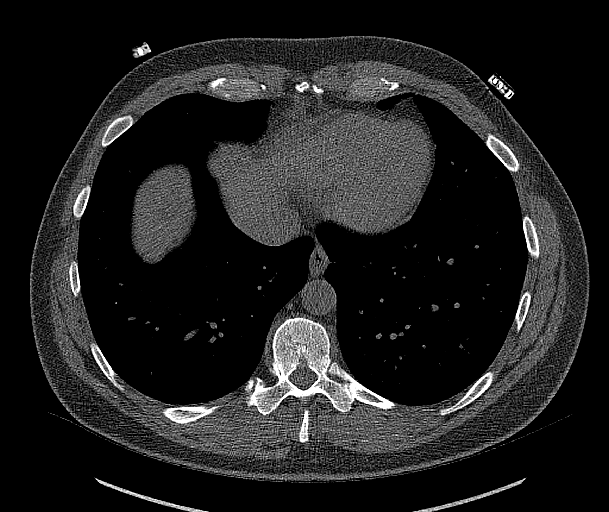
[im 27/79  vessel]
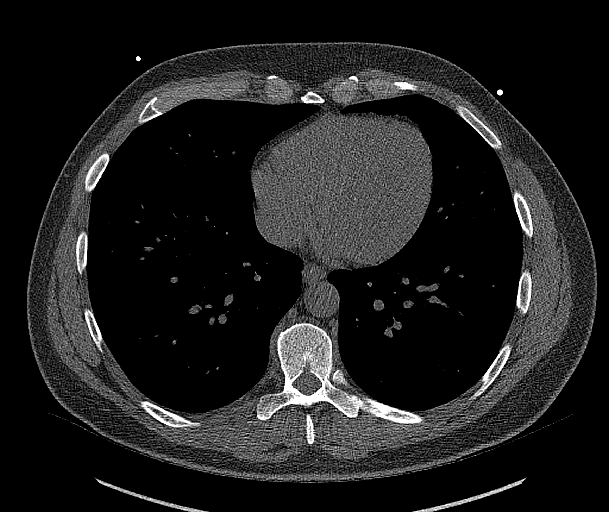
[im 40/79  vessel]
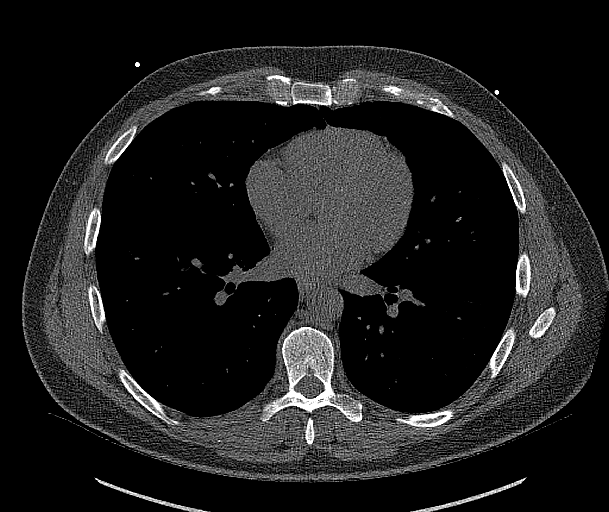
[im 53/79  vessel]
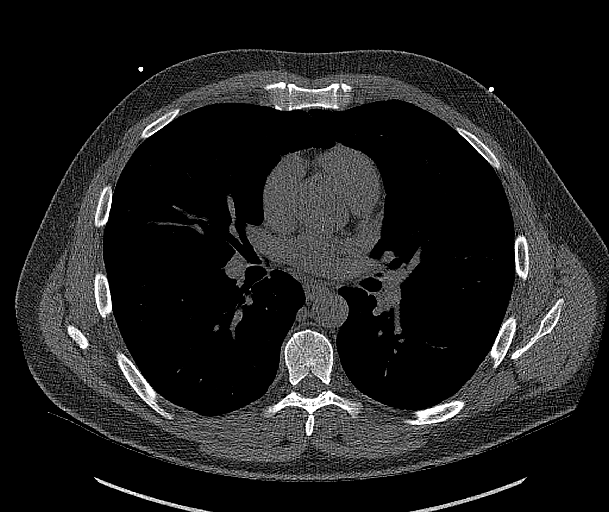
[im 66/79  vessel]
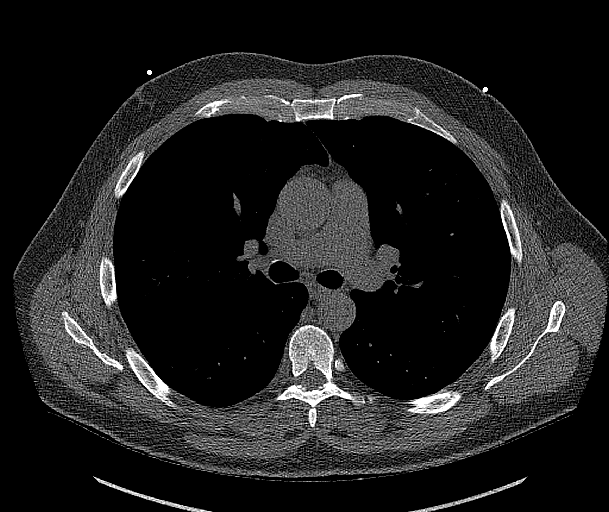

[14 of 20 positions shown; findings below may reference images not displayed]

FINDINGS: Technical quality: Good.

CORONARY CALCIUM

Total Agatston Score: 37.6 with calcifications in the left anterior
descending and right coronary arteries

[HOSPITAL] percentile:  77

OTHER FINDINGS:

Cardiovascular: Heart is normal size. Aorta is normal caliber
measuring 3.5 cm maximally in the ascending thoracic aorta.

Mediastinum/Nodes: No adenopathy in the lower mediastinum or hila.

Lungs/Pleura: Visualized lungs clear.  No effusions.

Upper Abdomen: Imaging into the upper abdomen shows no acute
findings.

Musculoskeletal: Chest wall soft tissues are unremarkable. No acute
bony abnormality.
IMPRESSION: The observed calcium score of 37.6 is at the percentile 77 for
subjects of the same age, gender and race/ethnicity who are free of
clinical cardiovascular disease and treated diabetes.

No acute or significant extracardiac abnormality

## 2021-08-06 DIAGNOSIS — G4733 Obstructive sleep apnea (adult) (pediatric): Secondary | ICD-10-CM | POA: Diagnosis not present

## 2021-08-10 ENCOUNTER — Telehealth: Payer: Self-pay | Admitting: Neurology

## 2021-08-10 NOTE — Telephone Encounter (Signed)
LVM asking pt to call back to schedule initial CPAP, needs between 11/06-01/04 and can see Dohmeier, Amy, Jinny Blossom, or Janett Billow.

## 2021-09-06 DIAGNOSIS — G4733 Obstructive sleep apnea (adult) (pediatric): Secondary | ICD-10-CM | POA: Diagnosis not present

## 2021-09-14 NOTE — Progress Notes (Signed)
PATIENT: Patrick Harper DOB: 1966-06-15  REASON FOR VISIT: follow up HISTORY FROM: patient   HISTORY OF PRESENT ILLNESS: Today 09/15/21:  Patrick Harper is a 55 year old male with a history of obstructive sleep apnea on CPAP.  He returns today for follow-up.  He reports that the CPAP is working well.  He does state that the nasal pillows sometimes irritates his nose.  He has noticed the benefit since he started using CPAP.  He returns today for an evaluation.      HISTORY 08/20/20: (Copied from Dr.Dohmeier's note)   Patrick Harper is a 55  Year- old  Caucasian male patient and seen here in a sleep consultation  on 08/20/2020 from Dr Virgina Jock.  Chief concern according to patient : Patrick Harper explained that he has tried to reduce his snoring and gasping in his sleep by using a dental device but was not successful and actually did not fit well.  He has he has also tried nasal strips without much success either.  He wakes up and feels not refreshed not restored from his sleep even if he averages 6 to 7 hours.  He does often reporting fragmented interrupted sleep.   I have the pleasure of seeing Patrick Harper on 08-20-2020, a right -handed Caucasian male with a possible sleep disorder.  He  has a past medical history of Anxiety and GERD (gastroesophageal reflux disease). He had hernia surgery as a child.   His dental device was made by a dentist but he did not have a sleep study prior or after.   Sleep relevant medical history: Nocturia 1-2,  Tonsillectomy.     Family medical /sleep history: no other family member on CPAP with OSA, nobody with insomnia, no sleep walkers.    Social history:  Patient is working as a Chief Strategy Officer , for  the [employer], in conjunction with the [employer], and lives in a household with spouse - daughter is out of college, youngest started college at [employer].  The patient currently works in outreach, irregular hours.  Pets are present. Tobacco use;  never .  ETOH use - socially, Caffeine intake in form of Coffee( daily 2-3 cups ) Soda( diet/ 2 week) Tea (/) or energy drinks.Regular exercise in form of golf..   Hobbies : golf. Sleep habits are as follows: The patient's dinner time is between 6-8 PM.  The patient goes to bed at 10 PM and continues to sleep for several hours, wakes for one bathroom break, at 2  AM.   The preferred sleep position is sideways, with the support of 2 pillows.  Dreams are reportedly frequent/ kicking in his sleep.Marland Kitchen   6 -7 AM is the usual rise time.  The patient wakes up spontaneously.   She reports not feeling refreshed or restored in AM, with symptoms such as dry mouth , rare morning headaches , and residual fatigue.  Naps are taken frequently, since working form home - 3-4 days a week, lasting from 20-30 minutes and are more refreshing than nocturnal sleep.   REVIEW OF SYSTEMS: Out of a complete 14 system review of symptoms, the patient complains only of the following symptoms, and all other reviewed systems are negative.   ESS 9  ALLERGIES: No Known Allergies  HOME MEDICATIONS: Outpatient Medications Prior to Visit  Medication Sig Dispense Refill   buPROPion (WELLBUTRIN XL) 150 MG 24 hr tablet      citalopram (CELEXA) 40 MG tablet Take  40 mg by mouth daily.      ibuprofen (ADVIL) 200 MG tablet 1-2 tabs by mouth as needed for discomfort     Multiple Vitamin (MULTI-VITAMIN) tablet Take by mouth.     rosuvastatin (CRESTOR) 10 MG tablet Take 10 mg by mouth at bedtime.     No facility-administered medications prior to visit.    PAST MEDICAL HISTORY: Past Medical History:  Diagnosis Date   Anxiety    GERD (gastroesophageal reflux disease)     PAST SURGICAL HISTORY: Past Surgical History:  Procedure Laterality Date   COLOSTOMY     TONSILLECTOMY     as child   VASECTOMY      FAMILY HISTORY: Family History  Problem Relation Age of Onset   Brain cancer Father    Colon cancer Neg Hx    Colon  polyps Neg Hx    Esophageal cancer Neg Hx    Rectal cancer Neg Hx    Stomach cancer Neg Hx    Sleep apnea Neg Hx     SOCIAL HISTORY: Social History   Socioeconomic History   Marital status: Married    Spouse name: Not on file   Number of children: Not on file   Years of education: Not on file   Highest education level: Not on file  Occupational History   Not on file  Tobacco Use   Smoking status: Never   Smokeless tobacco: Never  Vaping Use   Vaping Use: Never used  Substance and Sexual Activity   Alcohol use: Yes    Alcohol/week: 11.0 standard drinks    Types: 4 Glasses of wine, 6 Cans of beer, 1 Shots of liquor per week   Drug use: No   Sexual activity: Not on file  Other Topics Concern   Not on file  Social History Narrative   Not on file   Social Determinants of Health   Financial Resource Strain: Not on file  Food Insecurity: Not on file  Transportation Needs: Not on file  Physical Activity: Not on file  Stress: Not on file  Social Connections: Not on file  Intimate Partner Violence: Not on file      PHYSICAL EXAM  Vitals:   09/15/21 0952  BP: 127/87  Pulse: 92  Weight: 213 lb 9.6 oz (96.9 kg)  Height: 6\' 1"  (1.854 m)   Body mass index is 28.18 kg/m.  Generalized: Well developed, in no acute distress  Chest: Lungs clear to auscultation bilaterally  Neurological examination  Mentation: Alert oriented to time, place, history taking. Follows all commands speech and language fluent Cranial nerve II-XII: Extraocular movements were full, visual field were full on confrontational test Head turning and shoulder shrug  were normal and symmetric. Motor: The motor testing reveals 5 over 5 strength of all 4 extremities. Good symmetric motor tone is noted throughout.  Sensory: Sensory testing is intact to soft touch on all 4 extremities. No evidence of extinction is noted.  Gait and station: Gait is normal.    DIAGNOSTIC DATA (LABS, IMAGING, TESTING) - I  reviewed patient records, labs, notes, testing and imaging myself where available.      ASSESSMENT AND PLAN 55 y.o. year old male  has a past medical history of Anxiety and GERD (gastroesophageal reflux disease). here with:  OSA on CPAP  - CPAP compliance excellent - Good treatment of AHI  - Encourage patient to use CPAP nightly and > 4 hours each night - F/U in 1 year or sooner  if needed    Ward Givens, MSN, NP-C 09/15/2021, 10:10 AM [facility] [address] [place], [place] [address] [phone]

## 2021-09-15 ENCOUNTER — Encounter: Payer: Self-pay | Admitting: Adult Health

## 2021-09-15 ENCOUNTER — Ambulatory Visit: Payer: BC Managed Care – PPO | Admitting: Adult Health

## 2021-09-15 VITALS — BP 127/87 | HR 92 | Ht 73.0 in | Wt 213.6 lb

## 2021-09-15 DIAGNOSIS — G4733 Obstructive sleep apnea (adult) (pediatric): Secondary | ICD-10-CM

## 2021-09-15 DIAGNOSIS — Z9989 Dependence on other enabling machines and devices: Secondary | ICD-10-CM | POA: Diagnosis not present

## 2021-09-15 NOTE — Patient Instructions (Signed)
Continue using CPAP nightly and greater than 4 hours each night °If your symptoms worsen or you develop new symptoms please let us know.  ° °

## 2021-10-06 DIAGNOSIS — G4733 Obstructive sleep apnea (adult) (pediatric): Secondary | ICD-10-CM | POA: Diagnosis not present

## 2021-11-03 DIAGNOSIS — Z125 Encounter for screening for malignant neoplasm of prostate: Secondary | ICD-10-CM | POA: Diagnosis not present

## 2021-11-03 DIAGNOSIS — D72819 Decreased white blood cell count, unspecified: Secondary | ICD-10-CM | POA: Diagnosis not present

## 2021-11-06 DIAGNOSIS — G4733 Obstructive sleep apnea (adult) (pediatric): Secondary | ICD-10-CM | POA: Diagnosis not present

## 2021-11-09 DIAGNOSIS — I2584 Coronary atherosclerosis due to calcified coronary lesion: Secondary | ICD-10-CM | POA: Diagnosis not present

## 2021-11-09 DIAGNOSIS — Z23 Encounter for immunization: Secondary | ICD-10-CM | POA: Diagnosis not present

## 2021-11-09 DIAGNOSIS — Z0000 Encounter for general adult medical examination without abnormal findings: Secondary | ICD-10-CM | POA: Diagnosis not present

## 2021-11-09 DIAGNOSIS — Z1331 Encounter for screening for depression: Secondary | ICD-10-CM | POA: Diagnosis not present

## 2021-11-09 DIAGNOSIS — Z1339 Encounter for screening examination for other mental health and behavioral disorders: Secondary | ICD-10-CM | POA: Diagnosis not present

## 2022-04-14 DIAGNOSIS — J0100 Acute maxillary sinusitis, unspecified: Secondary | ICD-10-CM | POA: Diagnosis not present

## 2022-04-14 DIAGNOSIS — J029 Acute pharyngitis, unspecified: Secondary | ICD-10-CM | POA: Diagnosis not present

## 2022-09-13 ENCOUNTER — Encounter: Payer: Self-pay | Admitting: *Deleted

## 2022-09-14 ENCOUNTER — Telehealth: Payer: BC Managed Care – PPO | Admitting: Adult Health

## 2022-11-10 DIAGNOSIS — E785 Hyperlipidemia, unspecified: Secondary | ICD-10-CM | POA: Diagnosis not present

## 2022-11-10 DIAGNOSIS — Z125 Encounter for screening for malignant neoplasm of prostate: Secondary | ICD-10-CM | POA: Diagnosis not present

## 2022-11-17 ENCOUNTER — Encounter: Payer: Self-pay | Admitting: Gastroenterology

## 2022-11-18 DIAGNOSIS — Z0000 Encounter for general adult medical examination without abnormal findings: Secondary | ICD-10-CM | POA: Diagnosis not present

## 2022-11-18 DIAGNOSIS — Z23 Encounter for immunization: Secondary | ICD-10-CM | POA: Diagnosis not present

## 2022-11-18 DIAGNOSIS — R82998 Other abnormal findings in urine: Secondary | ICD-10-CM | POA: Diagnosis not present

## 2022-11-18 DIAGNOSIS — Z1331 Encounter for screening for depression: Secondary | ICD-10-CM | POA: Diagnosis not present

## 2022-11-18 DIAGNOSIS — I2584 Coronary atherosclerosis due to calcified coronary lesion: Secondary | ICD-10-CM | POA: Diagnosis not present

## 2022-11-18 DIAGNOSIS — Z1389 Encounter for screening for other disorder: Secondary | ICD-10-CM | POA: Diagnosis not present

## 2023-03-09 DIAGNOSIS — G4733 Obstructive sleep apnea (adult) (pediatric): Secondary | ICD-10-CM | POA: Diagnosis not present

## 2023-03-09 DIAGNOSIS — F419 Anxiety disorder, unspecified: Secondary | ICD-10-CM | POA: Diagnosis not present

## 2023-03-09 DIAGNOSIS — R41840 Attention and concentration deficit: Secondary | ICD-10-CM | POA: Diagnosis not present

## 2023-09-20 DIAGNOSIS — M5416 Radiculopathy, lumbar region: Secondary | ICD-10-CM | POA: Diagnosis not present

## 2023-09-20 DIAGNOSIS — M545 Low back pain, unspecified: Secondary | ICD-10-CM | POA: Diagnosis not present

## 2023-11-16 DIAGNOSIS — E785 Hyperlipidemia, unspecified: Secondary | ICD-10-CM | POA: Diagnosis not present

## 2023-11-16 DIAGNOSIS — Z125 Encounter for screening for malignant neoplasm of prostate: Secondary | ICD-10-CM | POA: Diagnosis not present

## 2023-11-24 DIAGNOSIS — E785 Hyperlipidemia, unspecified: Secondary | ICD-10-CM | POA: Diagnosis not present

## 2023-11-24 DIAGNOSIS — Z1339 Encounter for screening examination for other mental health and behavioral disorders: Secondary | ICD-10-CM | POA: Diagnosis not present

## 2023-11-24 DIAGNOSIS — R82998 Other abnormal findings in urine: Secondary | ICD-10-CM | POA: Diagnosis not present

## 2023-11-24 DIAGNOSIS — Z1331 Encounter for screening for depression: Secondary | ICD-10-CM | POA: Diagnosis not present

## 2023-11-24 DIAGNOSIS — Z0000 Encounter for general adult medical examination without abnormal findings: Secondary | ICD-10-CM | POA: Diagnosis not present
